# Patient Record
Sex: Female | Born: 1975 | Race: Asian | Hispanic: No | Marital: Single | Smoking: Never smoker
Health system: Southern US, Community
[De-identification: ages and names within clinical notes are randomized; demographics above are authoritative.]

---

## 2008-09-18 ENCOUNTER — Inpatient Hospital Stay (HOSPITAL_COMMUNITY): Admission: AD | Admit: 2008-09-18 | Discharge: 2008-09-18 | Payer: Self-pay | Admitting: Obstetrics and Gynecology

## 2008-09-18 ENCOUNTER — Inpatient Hospital Stay (HOSPITAL_COMMUNITY): Admission: AD | Admit: 2008-09-18 | Discharge: 2008-09-21 | Payer: Self-pay | Admitting: Obstetrics & Gynecology

## 2010-08-16 LAB — CBC
HCT: 39.3 % (ref 36.0–46.0)
Hemoglobin: 13.6 g/dL (ref 12.0–15.0)
MCHC: 34.7 g/dL (ref 30.0–36.0)
MCHC: 35.4 g/dL (ref 30.0–36.0)
MCV: 93.6 fL (ref 78.0–100.0)
MCV: 93.8 fL (ref 78.0–100.0)
Platelets: 183 10*3/uL (ref 150–400)
RDW: 13 % (ref 11.5–15.5)
RDW: 13.1 % (ref 11.5–15.5)

## 2010-09-20 NOTE — Op Note (Signed)
Shannon Copeland, Shannon Copeland                  ACCOUNT NO.:  [id number]   MEDICAL RECORD NO.:  [id number]          PATIENT TYPE:  INP   LOCATION:  9114                          FACILITY:  [facility]   PHYSICIAN:  Genia Del, M.D.DATE OF BIRTH:  July 31, 1975   DATE OF PROCEDURE:  09/18/2008  DATE OF DISCHARGE:                               OPERATIVE REPORT   PREOPERATIVE DIAGNOSIS:  A 40 weeks and 1 day, complete breech, active  labor with complete dilatation.   POSTOPERATIVE DIAGNOSIS:  A 40 weeks and 1 day, complete breech, active  labor with complete dilatation.   PROCEDURE:  Urgent low transverse primary cesarean section.   SURGEON:  Genia Del, MD   ASSISTANT:  None.   ANESTHESIOLOGIST:  Dr. Malen Gauze.   PROCEDURE IN DETAIL:  Under spinal anesthesia, the patient was in the  left decubitus position, 15 degrees.  She was prepped with Betadine on  the abdominal suprapubic and vulvar areas and draped as usual.  The  bladder was catheterized and the Foley was left in place.  We verified  the level of anesthesia which was adequate.  We then made a Pfannenstiel  incision with a scalpel.  We opened the adipose tissue with the  electrocautery, complete hemostasis as needed.  We then opened the  aponeurosis transversely with Mayo scissors.  We separated the recti  muscles from the aponeurosis on the midline superiorly and inferiorly.  The parietal peritoneum was opened bluntly with a finger and then  extended with [employer].  We put the bladder retractor in place.  The visceral peritoneum ws opened transversely with Metz scissors over  the lower uterine segment.  We reclined the bladder downward,  repositioned the bladder retractor.  We then make a low transverse  hysterotomy with the scalpel.  We extend on each side with dressing  scissors.  The amniotic fluid was tented with meconium.  The fetus was  in complete breech presentation.  Birth of a baby boy, the cord was  clamped and cut.   The baby was suctioned and given to the neonatal team.  Apgars were 4 and 9, a pH was done which comes back at 7.3.  The  placenta was extracted manually and sent to Pathology.  Cold blood was  also done.  We did a uterine revision.  Pitocin was started in the IV  fluid.  The uterus contracts well.  It was normal in appearance.  Normal  tubes and normal ovaries.  We closed the hysterotomy in a first locked  running suture of Vicryl 0, a second layer in a mattress fashion was  done with Vicryl 0.  We then complete hemostasis with a figure-of-eight  in the middle and at the left angle.  Hemostasis was adequate.  We  repositioned the uterus in the abdomen.  We irrigated and suctioned the  abdominopelvic cavities, complete hemostasis on the recti muscles with  the electrocautery.  We closed the aponeurosis with two half-running  sutures of Vicryl 0.  Hemostasis was completed on the adipose tissue  with the electrocautery and the skin was  reapproximated with staples.  A  dry  dressing was applied.  The count of instruments and sponges were  complete x2.  The patient received a dose of Ancef 1 g IV at the  beginning of the intervention and no complications occurred and the  patient was brought to recovery room in good stable status.      Genia Del, M.D.  Electronically Signed     ML/MEDQ  D:  09/18/2008  T:  09/18/2008  Job:  [id number]

## 2010-09-20 NOTE — Discharge Summary (Signed)
Shannon Copeland, BELOTE                  ACCOUNT NO.:  [id number]   MEDICAL RECORD NO.:  [id number]          PATIENT TYPE:  INP   LOCATION:  9114                          FACILITY:  [facility]   PHYSICIAN:  Genia Del, M.D.DATE OF BIRTH:  June 04, 1975   DATE OF ADMISSION:  09/18/2008  DATE OF DISCHARGE:  09/21/2008                               DISCHARGE SUMMARY   ADMITTING DIAGNOSIS:  Gravida 1, para 0, at 48-1/7 weeks' term  intrauterine pregnancy, completely dilated with breech presentation for  urgent low transverse cesarean section.   HISTORY:  Gravida 1, para 0, [facility] Sep 17, 2008, prenatal care received at  [facility] since 13-5/7 weeks' gestation, primary  care Doral Digangi Marlinda Mike, certified nurse midwife.   RISK FACTORS DURING PREGNANCY:  None other than the patient was noted to  breech at 38-4/7 weeks' gestation followed by a noted vertex  presentation at 39-4/7 weeks' gestation, unstable lie.   PRENATAL LABORATORY DATA:  AB positive, antibody negative, RPR  nonreactive, rubella indeterminate, hepatitis B negative, HIV  nonreactive.   HOSPITAL COURSE:  The patient presented on Sep 18, 2008 at 7:24 to  maternity admissions unit in active labor and was noted to be completely  dilated at 40-1.7 weeks' term intrauterine pregnancy with a breech  presentation.  A decision was made for an urgent primary low transverse  cesarean section by Dr. Seymour Bars, and the patient was taken to the  operating room where she underwent a primary cesarean section under  spinal anesthesia, please see operative report.  The patient delivered a  live newborn female with spontaneous respirations on Sep 18, 2008, at  8:08, weighing 7 pounds 5 ounces, 20 and 1 quarter inch length, Apgars  were 4 and 7.  Cord pH was noted to be 7.3.  Placenta was removed  manually, 3-vessel umbilical cord.  The labs on postop day #1, the  patient was noted to have a hemoglobin of 9.9, hematocrit 28.0,  white  blood cell count of 10.4, and platelet count 141,000.  She underwent an  uneventful postoperative course on the mother-baby unit with the  exception of some breast-feeding difficulties due to inverted nipples  bilaterally.  Lactation consult and assistance was provided.  The  patient was discharged home on postop day #3 with instruction to follow  up in the office in 2 days for staple removal and Steri-Strip  application.  She will also schedule a 6-week postoperative visit at  [facility].  She was discharged home with  instructions to continue prenatal vitamins daily, ibuprofen 600 mg p.o.  q.6 h. p.r.n. for cramping or breast discomfort, Percocet 5/325 mg 1-2  tablets p.o. q.4-6 h. p.r.n. for incision pain.  Her incision at  delivery was noted to be without erythema or drainage.  There was mild  bruising noted below the incisional area, which is why decision was made  to remove her staples at postop day #5 versus postop day #3.  She was  given the [facility] postpartum instruction booklet and instructed  to keep the incision clean and  dry and to call the office with any  complaints, concerns, or questions.  She was discharged home on Sep 21, 2008, at approximately 10 o'clock a.m.   DISCHARGE DIAGNOSES:  Gravida 1, para 1, primary low transverse cesarean  section on postop day #3 now, breast-feeding stable with acute blood  loss anemia, inverted nipples, and breast engorgement.   DISCHARGING PHYSICIAN:  Maxie Better, MD      Merrilee Jansky, CNM      Genia Del, M.D.  Electronically Signed    DL/MEDQ  D:  16/02/9603  T:  09/22/2008  Job:  [id number]

## 2015-04-05 ENCOUNTER — Emergency Department (HOSPITAL_COMMUNITY)
Admission: EM | Admit: 2015-04-05 | Discharge: 2015-04-05 | Disposition: A | Discharge status: Home / Self Care | Payer: BLUE CROSS/BLUE SHIELD | Attending: Emergency Medicine | Admitting: Emergency Medicine

## 2015-04-05 ENCOUNTER — Emergency Department (HOSPITAL_COMMUNITY)
Admission: EM | Admit: 2015-04-05 | Discharge: 2015-04-05 | Discharge status: Against Medical Advice | Payer: Self-pay | Attending: Emergency Medicine | Admitting: Emergency Medicine

## 2015-04-05 DIAGNOSIS — Z3202 Encounter for pregnancy test, result negative: Secondary | ICD-10-CM | POA: Insufficient documentation

## 2015-04-05 DIAGNOSIS — R109 Unspecified abdominal pain: Secondary | ICD-10-CM | POA: Insufficient documentation

## 2015-04-05 DIAGNOSIS — R112 Nausea with vomiting, unspecified: Secondary | ICD-10-CM | POA: Diagnosis present

## 2015-04-05 DIAGNOSIS — K529 Noninfective gastroenteritis and colitis, unspecified: Secondary | ICD-10-CM

## 2015-04-05 LAB — URINALYSIS, ROUTINE W REFLEX MICROSCOPIC
BILIRUBIN URINE: NEGATIVE
Glucose, UA: NEGATIVE mg/dL
KETONES UR: NEGATIVE mg/dL
NITRITE: NEGATIVE
PROTEIN: NEGATIVE mg/dL
Specific Gravity, Urine: 1.017 (ref 1.005–1.030)
pH: 5 (ref 5.0–8.0)

## 2015-04-05 LAB — URINE MICROSCOPIC-ADD ON

## 2015-04-05 LAB — PREGNANCY, URINE: PREG TEST UR: NEGATIVE

## 2015-04-05 MED ORDER — METOCLOPRAMIDE HCL 10 MG PO TABS
10.0000 mg | ORAL_TABLET | Freq: Once | ORAL | Status: AC
  Administered 2015-04-05: 10 mg via ORAL
  Filled 2015-04-05: qty 1

## 2015-04-05 MED ORDER — METOCLOPRAMIDE HCL 10 MG PO TABS: 10.0000 mg | ORAL_TABLET | Freq: Four times a day (QID) | ORAL | Status: AC

## 2015-04-05 NOTE — ED Notes (Signed)
pt arrived via EMS with c/o  N/V/D and chills. Cramps this a.m. After eating bowl of rice.  Noticed small amount of blood in the toilet at some point today.  BP: 128/74 P: 90's  CBG: 118

## 2015-04-05 NOTE — ED Provider Notes (Signed)
CSN: [id number]     Arrival date & time 04/05/15  0124 History   First MD Initiated Contact with Patient 04/05/15 0207     Chief Complaint  Patient presents with  . Nausea  . Emesis  . Diarrhea     (Consider location/radiation/quality/duration/timing/severity/associated sxs/prior Treatment) HPI Comments: Presents for evaluation of abdominal pain, nausea and vomiting that started just prior to arrival to the hospital. The abdominal pain is generalized. No diarrhea. She denies fever, hematemesis, dysuria. No sick contacts. Per her husband, she got up to the bathroom to urinate and saw a small amount of blood in the water.   The history is provided by the patient and the spouse. No language interpreter was used.    No past medical history on file. No past surgical history on file. No family history on file. Social History  Substance Use Topics  . Smoking status: Not on file  . Smokeless tobacco: Not on file  . Alcohol Use: Not on file   OB History    No data available     Review of Systems  Constitutional: Negative for fever.  Respiratory: Negative for shortness of breath.   Cardiovascular: Negative for chest pain.  Gastrointestinal: Positive for nausea, vomiting and abdominal pain. Negative for diarrhea, constipation and blood in stool.  Genitourinary: Negative for dysuria and vaginal discharge.  Musculoskeletal: Negative for myalgias.  Neurological: Negative for syncope and weakness.      Allergies  Review of patient's allergies indicates not on file.  Home Medications   Prior to Admission medications   Not on File   BP 124/82 mmHg  Pulse 81  Temp(Src) 98.4 F (36.9 C) (Oral)  Resp 18  Ht '5\' 2"'  (1.575 m)  Wt 48.988 kg  BMI 19.75 kg/m2  SpO2 98% Physical Exam  Constitutional: She is oriented to person, place, and time. She appears well-developed and well-nourished.  HENT:  Head: Normocephalic.  Neck: Normal range of motion. Neck supple.  Cardiovascular:  Normal rate and regular rhythm.   Pulmonary/Chest: Effort normal and breath sounds normal.  Abdominal: Soft. Bowel sounds are normal. There is tenderness. There is no rebound and no guarding.  Diffuse tenderness to soft abdomen.  Musculoskeletal: Normal range of motion.  Neurological: She is alert and oriented to person, place, and time. Coordination normal.  Skin: Skin is warm and dry. No rash noted.  Psychiatric: She has a normal mood and affect.    ED Course  Procedures (including critical care time) Labs Review Labs Reviewed  URINALYSIS, ROUTINE W REFLEX MICROSCOPIC (NOT AT [facility])  PREGNANCY, URINE    Imaging Review No results found. I have personally reviewed and evaluated these images and lab results as part of my medical decision-making.   EKG Interpretation None      MDM   Final diagnoses:  None    1. Gastroenteritis  No vomiting since arrival in ED. Tolerating PO fluids. She reports abdominal pain is better after Reglan. UA/U-preg are negative.   Ret-examination of abdomen: Non-tender to palpation.  DDx: viral gastroenteritis vs food born - either requiring supportive measures ina patient who is currently asymptomatic. Stable for discharge home.     Charlann Lange, PA-C 70/[id number]/67 [id number]  Delora Fuel, MD 20/10/07 1219

## 2015-04-05 NOTE — ED Notes (Signed)
Family at bedside. 

## 2015-04-05 NOTE — ED Notes (Signed)
Bed: ZO10WA04 Expected date:  Expected time:  Means of arrival:  Comments: EMS 39yo F N/V/D

## 2015-04-05 NOTE — Discharge Instructions (Signed)

## 2015-04-05 NOTE — ED Notes (Signed)
PA at bedside.

## 2017-02-05 ENCOUNTER — Emergency Department (HOSPITAL_COMMUNITY)
Admission: EM | Admit: 2017-02-05 | Discharge: 2017-02-05 | Disposition: A | Discharge status: Home / Self Care | Payer: Worker's Compensation | Attending: Emergency Medicine | Admitting: Emergency Medicine

## 2017-02-05 ENCOUNTER — Encounter (HOSPITAL_COMMUNITY): Payer: Self-pay

## 2017-02-05 ENCOUNTER — Emergency Department (HOSPITAL_COMMUNITY): Payer: Worker's Compensation

## 2017-02-05 DIAGNOSIS — W01190A Fall on same level from slipping, tripping and stumbling with subsequent striking against furniture, initial encounter: Secondary | ICD-10-CM | POA: Diagnosis not present

## 2017-02-05 DIAGNOSIS — M79662 Pain in left lower leg: Secondary | ICD-10-CM

## 2017-02-05 DIAGNOSIS — S80812A Abrasion, left lower leg, initial encounter: Secondary | ICD-10-CM | POA: Diagnosis not present

## 2017-02-05 DIAGNOSIS — Y939 Activity, unspecified: Secondary | ICD-10-CM | POA: Insufficient documentation

## 2017-02-05 DIAGNOSIS — Y999 Unspecified external cause status: Secondary | ICD-10-CM | POA: Diagnosis not present

## 2017-02-05 DIAGNOSIS — Y929 Unspecified place or not applicable: Secondary | ICD-10-CM | POA: Insufficient documentation

## 2017-02-05 DIAGNOSIS — T148XXA Other injury of unspecified body region, initial encounter: Secondary | ICD-10-CM

## 2017-02-05 DIAGNOSIS — Z79899 Other long term (current) drug therapy: Secondary | ICD-10-CM | POA: Diagnosis not present

## 2017-02-05 DIAGNOSIS — S8992XA Unspecified injury of left lower leg, initial encounter: Secondary | ICD-10-CM | POA: Diagnosis present

## 2017-02-05 MED ORDER — IBUPROFEN 400 MG PO TABS: 400.0000 mg | ORAL_TABLET | Freq: Four times a day (QID) | ORAL | 0 refills | Status: AC | PRN

## 2017-02-05 NOTE — ED Notes (Signed)
Pt departed in NAD.  

## 2017-02-05 NOTE — ED Provider Notes (Signed)
[facility]-EMERGENCY DEPT Provider Note   CSN: [id number] Arrival date & time: 02/05/17  1848     History   Chief Complaint Chief Complaint  Patient presents with  . Leg Pain    HPI Shannon Copeland is a 41 y.o. female.  HPI  Patient, with no significant past medical history, presents to ED for evaluation of left shin pain that occurred after incident earlier today. She slipped on some oil on the floor and her left shin hit the wooden table. She has been having pain since then but has been ambulatory since the incident. She reports pain worse with palpation and weightbearing. Describes the pain as sharp. She denies any head injury, loss of consciousness, previous fracture, dislocation or procedure in the area.  History reviewed. No pertinent past medical history.  There are no active problems to display for this patient.   Past Surgical History:  Procedure Laterality Date  . CESAREAN SECTION      OB History    No data available       Home Medications    Prior to Admission medications   Medication Sig Start Date End Date Taking? Authorizing Provider  ibuprofen (ADVIL,MOTRIN) 400 MG tablet Take 1 tablet (400 mg total) by mouth every 6 (six) hours as needed. 02/05/17   Laryssa Hassing, PA-C  loperamide (IMODIUM A-D) 2 MG tablet Take 2 mg by mouth 4 (four) times daily as needed for diarrhea or loose stools.    [provider]  metoCLOPramide (REGLAN) 10 MG tablet Take 1 tablet (10 mg total) by mouth every 6 (six) hours. 04/05/15   Elpidio Anis, PA-C    Family History No family history on file.  Social History Social History  Substance Use Topics  . Smoking status: Never Smoker  . Smokeless tobacco: Never Used  . Alcohol use No     Allergies   Patient has no known allergies.   Review of Systems Review of Systems  Constitutional: Negative for chills and fever.  Eyes: Negative for photophobia and visual disturbance.  Gastrointestinal: Negative for nausea and  vomiting.  Musculoskeletal: Positive for arthralgias and gait problem. Negative for back pain, myalgias, neck pain and neck stiffness.  Skin: Positive for color change. Negative for rash.  Neurological: Negative for headaches.     Physical Exam Updated Vital Signs BP 125/86   Pulse 71   Temp 98 F (36.7 C) (Oral)   Resp 20   Ht 5' (1.524 m)   Wt 45.4 kg (100 lb)   LMP 01/29/2017   SpO2 100%   BMI 19.53 kg/m   Physical Exam  Constitutional: She appears well-developed and well-nourished. No distress.  HENT:  Head: Normocephalic and atraumatic.  Eyes: Conjunctivae and EOM are normal. No scleral icterus.  Neck: Normal range of motion.  Pulmonary/Chest: Effort normal. No respiratory distress.  Musculoskeletal: Normal range of motion. She exhibits tenderness. She exhibits no edema or deformity.       Legs: Tenderness to palpation of the left shin, with associated superficial skin abrasion and bruising noted. Full active and passive range of motion of knee and ankle.Sensation intact to light touch of bilateral lower extremities. 2+ DP pulses bilaterally.  Neurological: She is alert.  Skin: No rash noted. She is not diaphoretic.  Psychiatric: She has a normal mood and affect.  Nursing note and vitals reviewed.    ED Treatments / Results  Labs (all labs ordered are listed, but only abnormal results are displayed) Labs Reviewed - No data  to display  EKG  EKG Interpretation None       Radiology Dg Tibia/fibula Left  Result Date: 02/05/2017 CLINICAL DATA:  Pain EXAM: LEFT TIBIA AND FIBULA - 2 VIEW COMPARISON:  None. FINDINGS: There is no evidence of fracture or other focal bone lesions. Soft tissues are unremarkable. IMPRESSION: Negative. Electronically Signed   By: Jasmine Pang M.D.   On: 02/05/2017 20:39    Procedures Procedures (including critical care time)  Medications Ordered in ED Medications - No data to display   Initial Impression / Assessment and Plan /  ED Course  I have reviewed the triage vital signs and the nursing notes.  Pertinent labs & imaging results that were available during my care of the patient were reviewed by me and considered in my medical decision making (see chart for details).     Patient presents to ED for evaluation of the left shin pain after incident that occurred at work. She accidentally bumped her shin on a table while she fell on some hot oil on the floor. She has been ambulatory since the incident but reports pain worse with weightbearing. No history of head injury or loss of consciousness. On physical exam there is a superficial skin abrasion and mild bruising noted but no visible deformity. She is otherwise nontoxic appearing and in no acute distress. There is tenderness to palpation of the area but no changes in range of motion of ankle or knee. X-ray returned as negative for acute abnormality. I suspect that her pain is due from the movement, fall and bruising. We'll advise Ace wrapped and Ricetherapy. e'll give her ibuprofen to help with pain and inflammation. Patient appears stable for discharge at this time. Strict return precautions given.  Final Clinical Impressions(s) / ED Diagnoses   Final diagnoses:  Skin abrasion  Pain in left shin    New Prescriptions New Prescriptions   IBUPROFEN (ADVIL,MOTRIN) 400 MG TABLET    Take 1 tablet (400 mg total) by mouth every 6 (six) hours as needed.     Dietrich Pates, PA-C 02/05/17 2127    Eber Hong, MD 02/08/17 [phone]

## 2017-02-05 NOTE — ED Triage Notes (Signed)
Pt states that she was at work and slipped and hit her left shin on something. Superficial scratch to shin, and bruising noted.

## 2017-02-05 NOTE — Discharge Instructions (Signed)
Please read attached information regarding her condition. Take ibuprofen as needed for pain. Follow instructions for rice therapy. Return to ED for worsening pain, numbness in legs, trouble walking, additional injury.

## 2018-06-19 ENCOUNTER — Other Ambulatory Visit: Payer: Self-pay

## 2018-06-19 ENCOUNTER — Emergency Department (HOSPITAL_COMMUNITY)
Admission: EM | Admit: 2018-06-19 | Discharge: 2018-06-20 | Disposition: A | Discharge status: Home / Self Care | Payer: Managed Care, Other (non HMO) | Attending: Emergency Medicine | Admitting: Emergency Medicine

## 2018-06-19 ENCOUNTER — Emergency Department (HOSPITAL_COMMUNITY): Payer: Managed Care, Other (non HMO)

## 2018-06-19 ENCOUNTER — Encounter (HOSPITAL_COMMUNITY): Payer: Self-pay | Admitting: *Deleted

## 2018-06-19 DIAGNOSIS — R079 Chest pain, unspecified: Secondary | ICD-10-CM | POA: Diagnosis not present

## 2018-06-19 LAB — BASIC METABOLIC PANEL
Anion gap: 9 (ref 5–15)
BUN: 7 mg/dL (ref 6–20)
CALCIUM: 9.5 mg/dL (ref 8.9–10.3)
CO2: 26 mmol/L (ref 22–32)
Chloride: 104 mmol/L (ref 98–111)
Creatinine, Ser: 0.8 mg/dL (ref 0.44–1.00)
GFR calc non Af Amer: >60 mL/min (ref >60)
GLUCOSE: 97 mg/dL (ref 70–99)
POTASSIUM: 4.4 mmol/L (ref 3.5–5.1)
Sodium: 139 mmol/L (ref 135–145)

## 2018-06-19 LAB — I-STAT TROPONIN, ED: Troponin i, poc: 0 ng/mL (ref 0.00–0.08)

## 2018-06-19 LAB — CBC
HEMATOCRIT: 45.7 % (ref 36.0–46.0)
Hemoglobin: 14.4 g/dL (ref 12.0–15.0)
MCH: 28.9 pg (ref 26.0–34.0)
MCHC: 31.5 g/dL (ref 30.0–36.0)
MCV: 91.6 fL (ref 80.0–100.0)
Platelets: 249 10*3/uL (ref 150–400)
RBC: 4.99 MIL/uL (ref 3.87–5.11)
RDW: 12.3 % (ref 11.5–15.5)
WBC: 6.6 10*3/uL (ref 4.0–10.5)
nRBC: 0 % (ref 0.0–0.2)

## 2018-06-19 LAB — I-STAT BETA HCG BLOOD, ED (MC, WL, AP ONLY): I-stat hCG, quantitative: <5 m[IU]/mL (ref <5)

## 2018-06-19 MED ORDER — OXYCODONE-ACETAMINOPHEN 5-325 MG PO TABS
1.0000 | ORAL_TABLET | ORAL | Status: DC | PRN
  Administered 2018-06-19: 1 via ORAL
  Filled 2018-06-19: qty 1

## 2018-06-19 MED ORDER — SODIUM CHLORIDE 0.9% FLUSH: 3.0000 mL | Freq: Once | INTRAVENOUS | Status: DC

## 2018-06-19 NOTE — ED Triage Notes (Signed)
Pt reports L side cp that radiates to her back.  Pain is sharp, and is aggravated by taking a deep breath.  No dizziness, nausea, or diaphoresis.

## 2018-06-19 NOTE — ED Notes (Signed)
Pt wants to leave. Pt encouraged to stay. Pt told there is only one person in front. Pt refuses and gives back labels. Pt seen leaving lobby.

## 2018-06-19 NOTE — ED Triage Notes (Signed)
Per EMS, pt transported from work, reports sudden onset of L sided cp that radiates to her L shoulder.

## 2018-11-21 ENCOUNTER — Other Ambulatory Visit: Payer: Self-pay

## 2018-11-21 ENCOUNTER — Encounter (HOSPITAL_COMMUNITY): Payer: Self-pay

## 2018-11-21 ENCOUNTER — Emergency Department (HOSPITAL_COMMUNITY)
Admission: EM | Admit: 2018-11-21 | Discharge: 2018-11-21 | Disposition: A | Discharge status: Home / Self Care | Payer: Managed Care, Other (non HMO) | Attending: Emergency Medicine | Admitting: Emergency Medicine

## 2018-11-21 ENCOUNTER — Emergency Department (HOSPITAL_COMMUNITY): Payer: Managed Care, Other (non HMO)

## 2018-11-21 DIAGNOSIS — U071 COVID-19: Secondary | ICD-10-CM | POA: Diagnosis not present

## 2018-11-21 DIAGNOSIS — Z79899 Other long term (current) drug therapy: Secondary | ICD-10-CM | POA: Diagnosis not present

## 2018-11-21 DIAGNOSIS — R05 Cough: Secondary | ICD-10-CM | POA: Diagnosis present

## 2018-11-21 DIAGNOSIS — R059 Cough, unspecified: Secondary | ICD-10-CM

## 2018-11-21 NOTE — ED Triage Notes (Signed)
Arrives [facility] for [facility], cough, and abdominal pain that began today. Pt has several family members that are currently hospitalized with COVID-19.

## 2018-11-21 NOTE — ED Provider Notes (Signed)
[facility] DEPT Provider Note   CSN: [id number] Arrival date & time: 11/21/18  7425     History   Chief Complaint Chief Complaint  Patient presents with  . Shortness of Breath    and cough  . Abdominal Pain    HPI Shannon Copeland is a 43 y.o. female.     43 year old female presents with cough x1 day along with shortness of breath.  Patient's whole family has a positive for COVID.  She denies any fever or chills.  No vomiting or diarrhea.  Nothing makes her symptoms better or worse no treatment used prior to arrival.     History reviewed. No pertinent past medical history.  There are no active problems to display for this patient.   Past Surgical History:  Procedure Laterality Date  . CESAREAN SECTION       OB History   No obstetric history on file.      Home Medications    Prior to Admission medications   Medication Sig Start Date End Date Taking? Authorizing Provider  cetirizine (ZYRTEC) 10 MG tablet Take 10 mg by mouth daily. 01/05/17   [provider]  ibuprofen (ADVIL,MOTRIN) 400 MG tablet Take 1 tablet (400 mg total) by mouth every 6 (six) hours as needed. 02/05/17   Khatri, Hina, PA-C  metoCLOPramide (REGLAN) 10 MG tablet Take 1 tablet (10 mg total) by mouth every 6 (six) hours. Patient not taking: Reported on 02/05/2017 04/05/15   Charlann Lange, PA-C    Family History No family history on file.  Social History Social History   Tobacco Use  . Smoking status: Never Smoker  . Smokeless tobacco: Never Used  Substance Use Topics  . Alcohol use: No  . Drug use: Never     Allergies   Patient has no known allergies.   Review of Systems Review of Systems  All other systems reviewed and are negative.    Physical Exam Updated Vital Signs Wt 45.4 kg   SpO2 97%   BMI 19.55 kg/m   Physical Exam Vitals signs and nursing note reviewed.  Constitutional:      General: She is not in acute distress.  Appearance: Normal appearance. She is well-developed. She is not toxic-appearing.  HENT:     Head: Normocephalic and atraumatic.  Eyes:     General: Lids are normal.     Conjunctiva/sclera: Conjunctivae normal.     Pupils: Pupils are equal, round, and reactive to light.  Neck:     Musculoskeletal: Normal range of motion and neck supple.     Thyroid: No thyroid mass.     Trachea: No tracheal deviation.  Cardiovascular:     Rate and Rhythm: Normal rate and regular rhythm.     Heart sounds: Normal heart sounds. No murmur. No gallop.   Pulmonary:     Effort: Pulmonary effort is normal. No respiratory distress.     Breath sounds: Normal breath sounds. No stridor. No decreased breath sounds, wheezing, rhonchi or rales.  Abdominal:     General: Bowel sounds are normal. There is no distension.     Palpations: Abdomen is soft.     Tenderness: There is no abdominal tenderness. There is no rebound.  Musculoskeletal: Normal range of motion.        General: No tenderness.  Skin:    General: Skin is warm and dry.     Findings: No abrasion or rash.  Neurological:     Mental Status: She is  alert and oriented to person, place, and time.     GCS: GCS eye subscore is 4. GCS verbal subscore is 5. GCS motor subscore is 6.     Cranial Nerves: No cranial nerve deficit.     Sensory: No sensory deficit.  Psychiatric:        Speech: Speech normal.        Behavior: Behavior normal.      ED Treatments / Results  Labs (all labs ordered are listed, but only abnormal results are displayed) Labs Reviewed  NOVEL CORONAVIRUS, NAA (HOSPITAL ORDER, SEND-OUT TO REF LAB)    EKG None  Radiology No results found.  Procedures Procedures (including critical care time)  Medications Ordered in ED Medications - No data to display   Initial Impression / Assessment and Plan / ED Course  I have reviewed the triage vital signs and the nursing notes.  Pertinent labs & imaging results that were available  during my care of the patient were reviewed by me and considered in my medical decision making (see chart for details).       Patient's chest x-ray without acute findings here.  She is not hypoxic.  COVID tests sent patient given return precautions  Final Clinical Impressions(s) / ED Diagnoses   Final diagnoses:  None    ED Discharge Orders    None       Lorre NickAllen, Josclyn Rosales, MD 11/21/18 2022

## 2018-11-22 LAB — NOVEL CORONAVIRUS, NAA (HOSP ORDER, SEND-OUT TO REF LAB; TAT 18-24 HRS): SARS-CoV-2, NAA: DETECTED — AB

## 2020-11-14 IMAGING — DX PORTABLE CHEST - 1 VIEW
1 series · 1 of 1 positions shown · non-contrast
Comparison: Chest radiograph 06/19/2018

CLINICAL DATA: Cough

EXAM:
PORTABLE CHEST 1 VIEW

[chest ap]
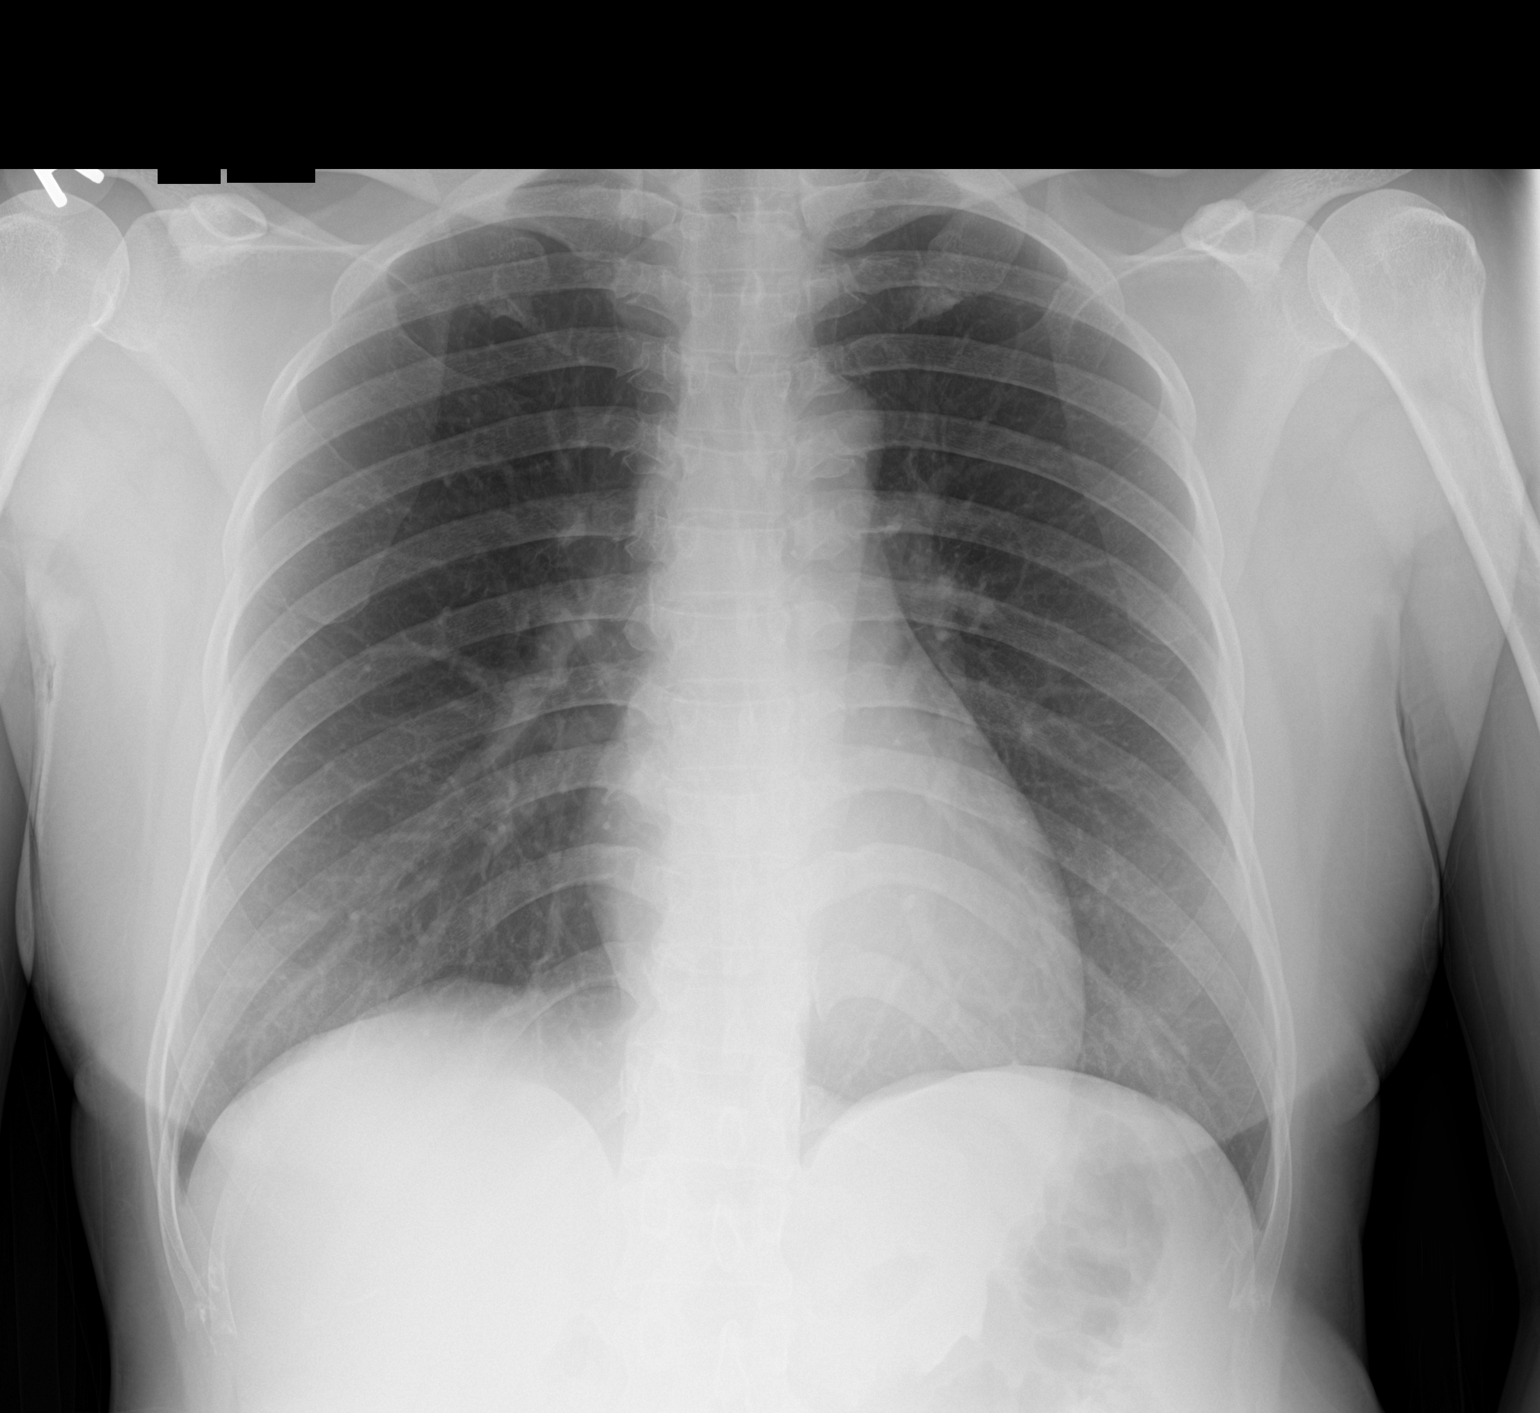

[1 of 1 positions shown; findings below may reference images not displayed]

FINDINGS: Normal cardiac and mediastinal contours. No consolidative pulmonary
opacities. No pleural effusion or pneumothorax. Osseous structures
unremarkable.
IMPRESSION: No acute cardiopulmonary process.
